# Patient Record
Sex: Male | Born: 1965 | Race: White | Hispanic: No | Marital: Married | Smoking: Never smoker
Health system: Southern US, Community
[De-identification: ages and names within clinical notes are randomized; demographics above are authoritative.]

## PROBLEM LIST (undated history)

## (undated) DIAGNOSIS — M1990 Unspecified osteoarthritis, unspecified site: Secondary | ICD-10-CM

## (undated) DIAGNOSIS — J189 Pneumonia, unspecified organism: Secondary | ICD-10-CM

## (undated) HISTORY — PX: WISDOM TOOTH EXTRACTION: SHX21

---

## 2005-01-08 HISTORY — PX: OTHER SURGICAL HISTORY: SHX169

## 2008-01-09 HISTORY — PX: HERNIA REPAIR: SHX51

## 2012-01-09 HISTORY — PX: CHOLECYSTECTOMY: SHX55

## 2014-10-12 ENCOUNTER — Other Ambulatory Visit: Payer: Self-pay | Admitting: Orthopedic Surgery

## 2014-10-12 DIAGNOSIS — M25511 Pain in right shoulder: Secondary | ICD-10-CM

## 2014-10-26 ENCOUNTER — Ambulatory Visit
Admission: RE | Admit: 2014-10-26 | Discharge: 2014-10-26 | Disposition: A | Discharge status: Home / Self Care | Payer: PRIVATE HEALTH INSURANCE | Source: Ambulatory Visit | Attending: Orthopedic Surgery | Admitting: Orthopedic Surgery

## 2014-10-26 DIAGNOSIS — M25511 Pain in right shoulder: Secondary | ICD-10-CM

## 2014-10-26 MED ORDER — IOHEXOL 180 MG/ML  SOLN
15.0000 mL | Freq: Once | INTRAMUSCULAR | Status: DC | PRN
  Administered 2014-10-26: 15 mL via INTRA_ARTICULAR

## 2015-03-30 ENCOUNTER — Other Ambulatory Visit: Payer: Self-pay | Admitting: Orthopedic Surgery

## 2015-04-06 ENCOUNTER — Encounter (HOSPITAL_COMMUNITY)
Admission: RE | Admit: 2015-04-06 | Discharge: 2015-04-06 | Disposition: A | Discharge status: Home / Self Care | Payer: Worker's Compensation | Source: Ambulatory Visit | Attending: Orthopedic Surgery | Admitting: Orthopedic Surgery

## 2015-04-06 ENCOUNTER — Encounter (HOSPITAL_COMMUNITY): Payer: Self-pay

## 2015-04-06 DIAGNOSIS — S43431A Superior glenoid labrum lesion of right shoulder, initial encounter: Secondary | ICD-10-CM | POA: Diagnosis not present

## 2015-04-06 DIAGNOSIS — X58XXXA Exposure to other specified factors, initial encounter: Secondary | ICD-10-CM | POA: Insufficient documentation

## 2015-04-06 DIAGNOSIS — Z01812 Encounter for preprocedural laboratory examination: Secondary | ICD-10-CM | POA: Insufficient documentation

## 2015-04-06 HISTORY — DX: Pneumonia, unspecified organism: J18.9

## 2015-04-06 HISTORY — DX: Unspecified osteoarthritis, unspecified site: M19.90

## 2015-04-06 LAB — BASIC METABOLIC PANEL
Anion gap: 8 (ref 5–15)
BUN: 10 mg/dL (ref 6–20)
CHLORIDE: 108 mmol/L (ref 101–111)
CO2: 24 mmol/L (ref 22–32)
CREATININE: 0.89 mg/dL (ref 0.61–1.24)
Calcium: 9.4 mg/dL (ref 8.9–10.3)
GFR calc non Af Amer: >60 mL/min (ref >60)
Glucose, Bld: 104 mg/dL — ABNORMAL HIGH (ref 65–99)
POTASSIUM: 4.3 mmol/L (ref 3.5–5.1)
Sodium: 140 mmol/L (ref 135–145)

## 2015-04-06 LAB — CBC
HEMATOCRIT: 47 % (ref 39.0–52.0)
Hemoglobin: 16.2 g/dL (ref 13.0–17.0)
MCH: 30.9 pg (ref 26.0–34.0)
MCHC: 34.5 g/dL (ref 30.0–36.0)
MCV: 89.7 fL (ref 78.0–100.0)
PLATELETS: 208 10*3/uL (ref 150–400)
RBC: 5.24 MIL/uL (ref 4.22–5.81)
RDW: 13 % (ref 11.5–15.5)
WBC: 5.6 10*3/uL (ref 4.0–10.5)

## 2015-04-06 NOTE — Progress Notes (Signed)
Pt. Denies all chest concerns, denies flulike symptoms.  Pt. Reports that he is not followed by a PCP, states he moved here only recently.  Pt. Has no recall of having an ekg in the past several yrs.  States prior to arm surgery > 10 yrs. Ago, he was told that his heart was strong & healthy.

## 2015-04-06 NOTE — Pre-Procedure Instructions (Signed)
Maurie BoettcherKevin Runnels  04/06/2015      [id number] [place], [place] - [facility] [place] [id number] Phone: [phone] Fax: [phone]    Your procedure is scheduled on 04/12/2015- TUESDAY  [facility] Admitting at 9:00 A.M.  Call this number if you have problems the morning of surgery:  [phone]   Remember:  Do not eat food or drink liquids after midnight. On Monday   Take these medicines the morning of surgery with A SIP OF WATER : NOTHING   Do not wear jewelry   Do not wear lotions, powders, or perfumes.  NO DEODORANT    Men may shave face and neck.   Do not bring valuables to the hospital.   [facility] for any belongings or valuables.  Contacts, dentures or bridgework may not be worn into surgery.  Leave your suitcase in the car.  After surgery it may be brought to your room.  For patients admitted to the hospital, discharge time will be determined by your treatment team.  Patients discharged the day of surgery will not be allowed to drive home.   Name and phone number of your driver:   With family  Special instructions:  Special Instructions: [place] - Preparing for Surgery  Before surgery, you can play an important role.  Because skin is not sterile, your skin needs to be as free of germs as possible.  You can reduce the number of germs on you skin by washing with CHG (chlorahexidine gluconate) soap before surgery.  CHG is an antiseptic cleaner which kills germs and bonds with the skin to continue killing germs even after washing.  Please DO NOT use if you have an allergy to CHG or antibacterial soaps.  If your skin becomes reddened/irritated stop using the CHG and inform your nurse when you arrive at Short Stay.  Do not shave (including legs and underarms) for at least 48 hours prior to the first CHG shower.  You may shave your face.  Please follow these  instructions carefully:   1.  Shower with CHG Soap the night before surgery and the  morning of Surgery.  2.  If you choose to wash your hair, wash your hair first as usual with your  normal shampoo.  3.  After you shampoo, rinse your hair and body thoroughly to remove the  Shampoo.  4.  Use CHG as you would any other liquid soap.  You can apply chg directly to the skin and wash gently with scrungie or a clean washcloth.  5.  Apply the CHG Soap to your body ONLY FROM THE NECK DOWN.    Do not use on open wounds or open sores.  Avoid contact with your eyes, ears, mouth and genitals (private parts).  Wash genitals (private parts)   with your normal soap.  6.  Wash thoroughly, paying special attention to the area where your surgery will be performed.  7.  Thoroughly rinse your body with warm water from the neck down.  8.  DO NOT shower/wash with your normal soap after using and rinsing off   the CHG Soap.  9.  Pat yourself dry with a clean towel.            10.  Wear clean pajamas.            11.  Place clean sheets on your bed the night of  your first shower and do not sleep with pets.  Day of Surgery  Do not apply any lotions/deodorants the morning of surgery.  Please wear clean clothes to the hospital/surgery center.  Please read over the following fact sheets that you were given. Pain Booklet, Coughing and Deep Breathing and Surgical Site Infection Prevention

## 2015-04-11 MED ORDER — CEFAZOLIN SODIUM-DEXTROSE 2-4 GM/100ML-% IV SOLN
2.0000 g | INTRAVENOUS | Status: AC
  Administered 2015-04-12: 2 g via INTRAVENOUS
  Filled 2015-04-11: qty 100

## 2015-04-12 ENCOUNTER — Encounter (HOSPITAL_COMMUNITY)
Admission: RE | Disposition: A | Discharge status: Home / Self Care | Payer: Self-pay | Source: Ambulatory Visit | Attending: Orthopedic Surgery

## 2015-04-12 ENCOUNTER — Ambulatory Visit (HOSPITAL_COMMUNITY)
Admission: RE | Admit: 2015-04-12 | Discharge: 2015-04-12 | Disposition: A | Discharge status: Home / Self Care | Payer: Worker's Compensation | Source: Ambulatory Visit | Attending: Orthopedic Surgery | Admitting: Orthopedic Surgery

## 2015-04-12 ENCOUNTER — Ambulatory Visit (HOSPITAL_COMMUNITY): Payer: Worker's Compensation | Admitting: Certified Registered Nurse Anesthetist

## 2015-04-12 ENCOUNTER — Encounter (HOSPITAL_COMMUNITY): Payer: Self-pay | Admitting: *Deleted

## 2015-04-12 DIAGNOSIS — S43431A Superior glenoid labrum lesion of right shoulder, initial encounter: Secondary | ICD-10-CM | POA: Insufficient documentation

## 2015-04-12 DIAGNOSIS — M7551 Bursitis of right shoulder: Secondary | ICD-10-CM | POA: Diagnosis not present

## 2015-04-12 DIAGNOSIS — X58XXXA Exposure to other specified factors, initial encounter: Secondary | ICD-10-CM | POA: Insufficient documentation

## 2015-04-12 DIAGNOSIS — Z5333 Arthroscopic surgical procedure converted to open procedure: Secondary | ICD-10-CM | POA: Diagnosis not present

## 2015-04-12 HISTORY — PX: SHOULDER ARTHROSCOPY WITH SUBACROMIAL DECOMPRESSION AND BICEP TENDON REPAIR: SHX5689

## 2015-04-12 SURGERY — SHOULDER ARTHROSCOPY WITH SUBACROMIAL DECOMPRESSION AND BICEP TENDON REPAIR
Anesthesia: General | Site: Shoulder | Laterality: Right

## 2015-04-12 MED ORDER — FENTANYL CITRATE (PF) 100 MCG/2ML IJ SOLN
100.0000 ug | Freq: Once | INTRAMUSCULAR | Status: AC
  Administered 2015-04-12: 100 ug via INTRAVENOUS

## 2015-04-12 MED ORDER — GLYCOPYRROLATE 0.2 MG/ML IJ SOLN
INTRAMUSCULAR | Status: AC
  Filled 2015-04-12: qty 1

## 2015-04-12 MED ORDER — SODIUM CHLORIDE 0.9 % IR SOLN
Status: DC | PRN
  Administered 2015-04-12: 6000 mL

## 2015-04-12 MED ORDER — ARTIFICIAL TEARS OP OINT
TOPICAL_OINTMENT | OPHTHALMIC | Status: AC
  Filled 2015-04-12: qty 3.5

## 2015-04-12 MED ORDER — LACTATED RINGERS IV SOLN
INTRAVENOUS | Status: DC
  Administered 2015-04-12 (×2): via INTRAVENOUS

## 2015-04-12 MED ORDER — EPHEDRINE SULFATE 50 MG/ML IJ SOLN
INTRAMUSCULAR | Status: AC
  Filled 2015-04-12: qty 1

## 2015-04-12 MED ORDER — SUGAMMADEX SODIUM 200 MG/2ML IV SOLN
INTRAVENOUS | Status: AC
  Filled 2015-04-12: qty 2

## 2015-04-12 MED ORDER — LIDOCAINE HCL (CARDIAC) 20 MG/ML IV SOLN
INTRAVENOUS | Status: DC | PRN
  Administered 2015-04-12: 100 mg via INTRATRACHEAL

## 2015-04-12 MED ORDER — MEPERIDINE HCL 25 MG/ML IJ SOLN: 6.2500 mg | INTRAMUSCULAR | Status: DC | PRN

## 2015-04-12 MED ORDER — ONDANSETRON HCL 4 MG/2ML IJ SOLN
INTRAMUSCULAR | Status: AC
  Filled 2015-04-12: qty 2

## 2015-04-12 MED ORDER — MIDAZOLAM HCL 5 MG/ML IJ SOLN: 2.0000 mg | Freq: Once | INTRAMUSCULAR | Status: DC

## 2015-04-12 MED ORDER — PHENYLEPHRINE HCL 10 MG/ML IJ SOLN
INTRAMUSCULAR | Status: DC | PRN
  Administered 2015-04-12 (×2): 40 ug via INTRAVENOUS

## 2015-04-12 MED ORDER — 0.9 % SODIUM CHLORIDE (POUR BTL) OPTIME
TOPICAL | Status: DC | PRN
  Administered 2015-04-12: 1000 mL

## 2015-04-12 MED ORDER — CHLORHEXIDINE GLUCONATE 4 % EX LIQD: 60.0000 mL | Freq: Once | CUTANEOUS | Status: DC

## 2015-04-12 MED ORDER — FENTANYL CITRATE (PF) 250 MCG/5ML IJ SOLN
INTRAMUSCULAR | Status: AC
  Filled 2015-04-12: qty 5

## 2015-04-12 MED ORDER — FENTANYL CITRATE (PF) 100 MCG/2ML IJ SOLN
INTRAMUSCULAR | Status: AC
  Filled 2015-04-12: qty 2

## 2015-04-12 MED ORDER — ARTIFICIAL TEARS OP OINT
TOPICAL_OINTMENT | OPHTHALMIC | Status: DC | PRN
  Administered 2015-04-12: 1 via OPHTHALMIC

## 2015-04-12 MED ORDER — HYDROMORPHONE HCL 1 MG/ML IJ SOLN: 0.2500 mg | INTRAMUSCULAR | Status: DC | PRN

## 2015-04-12 MED ORDER — ONDANSETRON HCL 4 MG/2ML IJ SOLN
INTRAMUSCULAR | Status: DC | PRN
  Administered 2015-04-12: 4 mg via INTRAVENOUS

## 2015-04-12 MED ORDER — PROPOFOL 10 MG/ML IV BOLUS
INTRAVENOUS | Status: AC
  Filled 2015-04-12: qty 20

## 2015-04-12 MED ORDER — ONDANSETRON HCL 4 MG/2ML IJ SOLN: 4.0000 mg | Freq: Once | INTRAMUSCULAR | Status: DC | PRN

## 2015-04-12 MED ORDER — METHOCARBAMOL 500 MG PO TABS: 500.0000 mg | ORAL_TABLET | Freq: Four times a day (QID) | ORAL | Status: AC

## 2015-04-12 MED ORDER — SUGAMMADEX SODIUM 200 MG/2ML IV SOLN
INTRAVENOUS | Status: DC | PRN
  Administered 2015-04-12: 200 mg via INTRAVENOUS

## 2015-04-12 MED ORDER — BUPIVACAINE-EPINEPHRINE (PF) 0.5% -1:200000 IJ SOLN
INTRAMUSCULAR | Status: DC | PRN
  Administered 2015-04-12: 30 mL via PERINEURAL

## 2015-04-12 MED ORDER — EPINEPHRINE HCL 1 MG/ML IJ SOLN
INTRAMUSCULAR | Status: AC
  Filled 2015-04-12: qty 1

## 2015-04-12 MED ORDER — GLYCOPYRROLATE 0.2 MG/ML IJ SOLN
INTRAMUSCULAR | Status: DC | PRN
  Administered 2015-04-12: 0.2 mg via INTRAVENOUS

## 2015-04-12 MED ORDER — HYDROCODONE-ACETAMINOPHEN 10-325 MG PO TABS: 1.0000 | ORAL_TABLET | ORAL | Status: DC | PRN

## 2015-04-12 MED ORDER — SUCCINYLCHOLINE CHLORIDE 20 MG/ML IJ SOLN
INTRAMUSCULAR | Status: AC
  Filled 2015-04-12: qty 1

## 2015-04-12 MED ORDER — FENTANYL CITRATE (PF) 250 MCG/5ML IJ SOLN
INTRAMUSCULAR | Status: DC | PRN
  Administered 2015-04-12: 50 ug via INTRAVENOUS
  Administered 2015-04-12: 150 ug via INTRAVENOUS
  Administered 2015-04-12: 50 ug via INTRAVENOUS

## 2015-04-12 MED ORDER — SODIUM CHLORIDE 0.9 % IJ SOLN
INTRAMUSCULAR | Status: DC | PRN
  Administered 2015-04-12: 20 mL
  Administered 2015-04-12: 30 mL

## 2015-04-12 MED ORDER — EPHEDRINE SULFATE 50 MG/ML IJ SOLN
INTRAMUSCULAR | Status: DC | PRN
  Administered 2015-04-12 (×3): 10 mg via INTRAVENOUS

## 2015-04-12 MED ORDER — MIDAZOLAM HCL 2 MG/2ML IJ SOLN
INTRAMUSCULAR | Status: AC
  Filled 2015-04-12: qty 2

## 2015-04-12 MED ORDER — ROCURONIUM BROMIDE 50 MG/5ML IV SOLN
INTRAVENOUS | Status: AC
  Filled 2015-04-12: qty 1

## 2015-04-12 MED ORDER — PHENYLEPHRINE 40 MCG/ML (10ML) SYRINGE FOR IV PUSH (FOR BLOOD PRESSURE SUPPORT)
PREFILLED_SYRINGE | INTRAVENOUS | Status: AC
  Filled 2015-04-12: qty 10

## 2015-04-12 MED ORDER — MIDAZOLAM HCL 2 MG/2ML IJ SOLN
INTRAMUSCULAR | Status: AC
  Administered 2015-04-12: 2 mg
  Filled 2015-04-12: qty 2

## 2015-04-12 MED ORDER — EPINEPHRINE HCL 1 MG/ML IJ SOLN
INTRAMUSCULAR | Status: DC | PRN
  Administered 2015-04-12: .1 mL

## 2015-04-12 MED ORDER — ROCURONIUM BROMIDE 100 MG/10ML IV SOLN
INTRAVENOUS | Status: DC | PRN
  Administered 2015-04-12: 50 mg via INTRAVENOUS

## 2015-04-12 MED ORDER — LIDOCAINE HCL (CARDIAC) 20 MG/ML IV SOLN
INTRAVENOUS | Status: AC
  Filled 2015-04-12: qty 5

## 2015-04-12 MED ORDER — MIDAZOLAM HCL 2 MG/2ML IJ SOLN
INTRAMUSCULAR | Status: DC | PRN
  Administered 2015-04-12 (×2): 1 mg via INTRAVENOUS

## 2015-04-12 MED ORDER — PROPOFOL 10 MG/ML IV BOLUS
INTRAVENOUS | Status: DC | PRN
  Administered 2015-04-12: 150 mg via INTRAVENOUS
  Administered 2015-04-12: 50 mg via INTRAVENOUS

## 2015-04-12 SURGICAL SUPPLY — 72 items
BENZOIN TINCTURE PRP APPL 2/3 (GAUZE/BANDAGES/DRESSINGS) ×2 IMPLANT
BIT DRILL TAK (DRILL) IMPLANT
BLADE CUDA 5.5 (BLADE) IMPLANT
BLADE CUTTER GATOR 3.5 (BLADE) ×2 IMPLANT
BLADE GREAT WHITE 4.2 (BLADE) IMPLANT
BLADE SURG 11 STRL SS (BLADE) ×2 IMPLANT
BUR GATOR 2.9 (BURR) IMPLANT
BUR OVAL 6.0 (BURR) ×2 IMPLANT
CANNULA SHOULDER 7CM (CANNULA) IMPLANT
CARTRIDGE CURVETEK MED (MISCELLANEOUS) IMPLANT
CARTRIDGE CURVETEK XLRG (MISCELLANEOUS) IMPLANT
COVER SURGICAL LIGHT HANDLE (MISCELLANEOUS) ×2 IMPLANT
DRAPE INCISE IOBAN 66X45 STRL (DRAPES) ×2 IMPLANT
DRAPE STERI 35X30 U-POUCH (DRAPES) ×2 IMPLANT
DRAPE U-SHAPE 47X51 STRL (DRAPES) ×4 IMPLANT
DRILL TAK (DRILL)
DRSG PAD ABDOMINAL 8X10 ST (GAUZE/BANDAGES/DRESSINGS) IMPLANT
DRSG TEGADERM 4X4.75 (GAUZE/BANDAGES/DRESSINGS) ×2 IMPLANT
DURAPREP 26ML APPLICATOR (WOUND CARE) ×2 IMPLANT
ELECT MENISCUS 165MM 90D (ELECTRODE) IMPLANT
ELECT REM PT RETURN 9FT ADLT (ELECTROSURGICAL) ×2
ELECTRODE REM PT RTRN 9FT ADLT (ELECTROSURGICAL) ×1 IMPLANT
FILTER STRAW FLUID ASPIR (MISCELLANEOUS) ×2 IMPLANT
GAUZE SPONGE 4X4 12PLY STRL (GAUZE/BANDAGES/DRESSINGS) ×2 IMPLANT
GAUZE XEROFORM 1X8 LF (GAUZE/BANDAGES/DRESSINGS) ×2 IMPLANT
GLOVE BIO SURGEON ST LM GN SZ9 (GLOVE) ×2 IMPLANT
GLOVE BIOGEL PI IND STRL 8 (GLOVE) ×1 IMPLANT
GLOVE BIOGEL PI INDICATOR 8 (GLOVE) ×1
GLOVE SURG ORTHO 8.0 STRL STRW (GLOVE) ×2 IMPLANT
GOWN STRL REUS W/ TWL LRG LVL3 (GOWN DISPOSABLE) ×3 IMPLANT
GOWN STRL REUS W/TWL LRG LVL3 (GOWN DISPOSABLE) ×3
KIT BASIN OR (CUSTOM PROCEDURE TRAY) ×2 IMPLANT
KIT ROOM TURNOVER OR (KITS) ×2 IMPLANT
MANIFOLD NEPTUNE II (INSTRUMENTS) ×2 IMPLANT
NDL SUT 6 .5 CRC .975X.05 MAYO (NEEDLE) ×1 IMPLANT
NEEDLE HYPO 25X1 1.5 SAFETY (NEEDLE) ×2 IMPLANT
NEEDLE MAYO TAPER (NEEDLE) ×1
NEEDLE SPNL 18GX3.5 QUINCKE PK (NEEDLE) ×2 IMPLANT
NEEDLE SPNL 22GX3.5 QUINCKE BK (NEEDLE) ×2 IMPLANT
NS IRRIG 1000ML POUR BTL (IV SOLUTION) ×2 IMPLANT
PACK SHOULDER (CUSTOM PROCEDURE TRAY) ×2 IMPLANT
PAD ARMBOARD 7.5X6 YLW CONV (MISCELLANEOUS) ×4 IMPLANT
SCREW  BIO TENODESIS 8X23 (Screw) ×1 IMPLANT
SCREW BIO TENODESIS 8X23 (Screw) ×1 IMPLANT
SCREW BIOCOMPOSITE 7X23 (Screw) IMPLANT
SET ARTHROSCOPY TUBING (MISCELLANEOUS) ×1
SET ARTHROSCOPY TUBING LN (MISCELLANEOUS) ×1 IMPLANT
SLING ARM IMMOBILIZER LRG (SOFTGOODS) ×2 IMPLANT
SLING ARM IMMOBILIZER MED (SOFTGOODS) IMPLANT
SPEAR FASTAKII (SLEEVE) IMPLANT
SPONGE LAP 4X18 X RAY DECT (DISPOSABLE) ×2 IMPLANT
STRIP CLOSURE SKIN 1/2X4 (GAUZE/BANDAGES/DRESSINGS) ×2 IMPLANT
SUCTION FRAZIER HANDLE 10FR (MISCELLANEOUS)
SUCTION TUBE FRAZIER 10FR DISP (MISCELLANEOUS) IMPLANT
SUT ETHILON 3 0 PS 1 (SUTURE) ×2 IMPLANT
SUT FIBERWIRE 2-0 18 17.9 3/8 (SUTURE)
SUT PROLENE 3 0 PS 2 (SUTURE) ×2 IMPLANT
SUT VIC AB 0 CT1 27 (SUTURE) ×2
SUT VIC AB 0 CT1 27XBRD ANBCTR (SUTURE) ×2 IMPLANT
SUT VIC AB 1 CT1 27 (SUTURE) ×1
SUT VIC AB 1 CT1 27XBRD ANBCTR (SUTURE) ×1 IMPLANT
SUT VIC AB 2-0 CT1 27 (SUTURE) ×1
SUT VIC AB 2-0 CT1 TAPERPNT 27 (SUTURE) ×1 IMPLANT
SUT VICRYL 0 UR6 27IN ABS (SUTURE) IMPLANT
SUTURE FIBERWR 2-0 18 17.9 3/8 (SUTURE) IMPLANT
SYR 20CC LL (SYRINGE) ×4 IMPLANT
SYR 3ML LL SCALE MARK (SYRINGE) ×2 IMPLANT
SYR TB 1ML LUER SLIP (SYRINGE) ×2 IMPLANT
TOWEL OR 17X24 6PK STRL BLUE (TOWEL DISPOSABLE) ×2 IMPLANT
TOWEL OR 17X26 10 PK STRL BLUE (TOWEL DISPOSABLE) ×2 IMPLANT
WAND HAND CNTRL MULTIVAC 90 (MISCELLANEOUS) ×2 IMPLANT
WATER STERILE IRR 1000ML POUR (IV SOLUTION) ×2 IMPLANT

## 2015-04-12 NOTE — H&P (Signed)
James BoettcherKevin Drake is an 50 y.o. male.   Chief Complaint:right shoulder pain  HPI: Caryn BeeKevin is a 50 year old patient with right shoulder pain. Sustained a work injury many months ago. MRI scan consistent with SLAP tear and bursitis. Rotator cuff is intact. Presents now for operative management after failure of nonoperative management Patient risks and benefits  Past Medical History  Diagnosis Date  . Pneumonia     treated as an OP  . Arthritis     arthritis - wrists     Past Surgical History  Procedure Laterality Date  . Arm surgery Left 2007    Larm, following   . Hernia repair Left 2010  . [place]olecystectomy  [place]    [place] , [place]  . Wisdom tooth extraction      no complications     History reviewed. No pertinent family history. Social History:  reports that he has never smoked. He does not have any smokeless tobacco history on file. He reports that he drinks alcohol. He reports that he does not use illicit drugs.  Allergies: No Known Allergies  Medications Prior to Admission  Medication Sig Dispense Refill  . acetaminophen (TYLENOL) 325 MG tablet Take 650 mg by mouth every 6 (six) hours as needed for mild pain.      No results found for this or any previous visit (from the past 48 hour(s)). No results found.  Review of Systems  Constitutional: Negative.   HENT: Negative.   Eyes: Negative.   Respiratory: Negative.   Cardiovascular: Negative.   Gastrointestinal: Negative.   Genitourinary: Negative.   Musculoskeletal: Positive for joint pain.  Skin: Negative.   Neurological: Negative.   Endo/Heme/Allergies: Negative.   Psychiatric/Behavioral: Negative.     Blood pressure 121/78, pulse 82, temperature 98.7 F (37.1 C), temperature source Oral, resp. rate 19, height 6' (1.829 m), weight 68.04 kg (150 lb), SpO2 100 %. Physical Exam  Constitutional: He appears well-developed.  HENT:  Head: Normocephalic.  Eyes: Pupils are equal, round, and reactive to light.  Neck: Normal  range of motion.  Cardiovascular: Normal rate.   Respiratory: Effort normal.  Neurological: He is alert.  Skin: Skin is warm.  Psychiatric: He has a normal mood and affect.  examination of the right shoulder demonstrates positive O'Brien's testing full active and passive range of motion good rotator cuff strength isolated infraspinatus supraspinatus and subscap muscle testing no before meals joint tenderness to direct palpation or cross arm adduction labral leg testing is positive for pain   Assessment/Plan Impression is right shoulder SLAP tear by MRI scan and by clinical exam and history plan arthroscopy and debridement biceps tendon release labral debridement subacromial decompression and biceps tenodesis. Risks and benefits discussed with the patient couldn't limited to infection or vessel damage and complete pain relief as well as the potential need for further surgery should the tendon become loose and I do anticipate using CPM machine postop to facilitate range of motion all questions answered  Cammy CopaEAN,Gianne Shugars SCOTT, MD 04/12/2015, 10:45 AM

## 2015-04-12 NOTE — Brief Op Note (Signed)
04/12/2015  1:47 PM  PATIENT:  James BoettcherKevin Drake  50 y.o. male  PRE-OPERATIVE DIAGNOSIS:  right shoulder superior labral tear from anterior to posterior tear  POST-OPERATIVE DIAGNOSIS:  right shoulder superior labral tear from anterior to posterior tear  PROCEDURE:  Procedure(s): RIGHT SHOULDER ARTHROSCOPY WITH SUBACROMIAL DECOMPRESSION, BICEPS TENODESIS, LABRAL DEBRIDEMENT  SURGEON:  Surgeon(s): Cammy CopaScott Gregory Dean, MD  ASSISTANT: Patrick Jupiterarla Bethune RNFA  ANESTHESIA:   general  EBL: 25 ml    Total I/O In: 1500 [I.V.:1500] Out: 25 [Blood:25]  BLOOD ADMINISTERED: none  DRAINS: none   LOCAL MEDICATIONS USED:  none  SPECIMEN:  No Specimen  COUNTS:  YES  TOURNIQUET:  * No tourniquets in log *  DICTATION: .Other Dictation: Dictation Number [id number]  PLAN OF CARE: Discharge to home after PACU  PATIENT DISPOSITION:  PACU - hemodynamically stable

## 2015-04-12 NOTE — Anesthesia Procedure Notes (Addendum)
Anesthesia Regional Block:  Interscalene brachial plexus block  Pre-Anesthetic Checklist: ,, timeout performed, Correct Patient, Correct Site, Correct Laterality, Correct Procedure, Correct Position, site marked, Risks and benefits discussed,  Surgical consent,  Pre-op evaluation,  At surgeon's request and post-op pain management  Laterality: Right  Prep: chloraprep       Needles:  Injection technique: Single-shot  Needle Type: Echogenic Stimulator Needle     Needle Length: 5cm 5 cm Needle Gauge: 21 and 21 G    Additional Needles:  Procedures: ultrasound guided (picture in chart) and nerve stimulator Interscalene brachial plexus block  Nerve Stimulator or Paresthesia:  Response: 0.4 mA,   Additional Responses:   Narrative:  Start time: 04/12/2015 9:50 AM End time: 04/12/2015 10:00 AM Injection made incrementally with aspirations every 5 mL.  Performed by: Personally  Anesthesiologist: Arta BruceSSEY, [place]  Additional Notes: Monitors applied. Patient sedated. Sterile prep and drape,hand hygiene and sterile gloves were used. Relevant anatomy identified.Needle position confirmed.Local anesthetic injected incrementally after negative aspiration. Local anesthetic spread visualized around nerve(s). Vascular puncture avoided. No complications. Image printed for medical record.The patient tolerated the procedure well.        Procedure Name: Intubation Date/Time: 04/12/2015 11:44 AM Performed by: Salomon MastWALL, Caryl Fate COREY Pre-anesthesia Checklist: Patient identified, Emergency Drugs available, Suction available and Patient being monitored Patient Re-evaluated:Patient Re-evaluated prior to inductionOxygen Delivery Method: Circle system utilized Preoxygenation: Pre-oxygenation with 100% oxygen Intubation Type: IV induction Ventilation: Mask ventilation without difficulty Laryngoscope Size: Mac and 3 Grade View: Grade II Tube type: Oral Tube size: 7.0 mm Number of attempts: 1 Airway Equipment  and Method: Stylet Placement Confirmation: ETT inserted through vocal cords under direct vision,  positive ETCO2 and breath sounds checked- equal and bilateral Secured at: 23 cm Tube secured with: Tape

## 2015-04-12 NOTE — Anesthesia Postprocedure Evaluation (Signed)
Anesthesia Post Note  Patient: Maurie BoettcherKevin Gewirtz  Procedure(s) Performed: Procedure(s) (LRB): RIGHT SHOULDER ARTHROSCOPY WITH SUBACROMIAL DECOMPRESSION, BICEPS TENODESIS, LABRAL DEBRIDEMENT (Right)  Patient location during evaluation: PACU Anesthesia Type: General Level of consciousness: awake and alert Pain management: pain level controlled Vital Signs Assessment: post-procedure vital signs reviewed and stable Respiratory status: spontaneous breathing, nonlabored ventilation, respiratory function stable and patient connected to nasal cannula oxygen Cardiovascular status: blood pressure returned to baseline and stable Postop Assessment: no signs of nausea or vomiting Anesthetic complications: no    Last Vitals:  Filed Vitals:   04/12/15 1500 04/12/15 1515  BP:  126/94  Pulse: 80 78  Temp:  36.1 C  Resp: 16 16    Last Pain:  Filed Vitals:   04/12/15 1525  PainSc: 0-No pain                 Arish Redner,[place] DAVID

## 2015-04-12 NOTE — Transfer of Care (Signed)
Immediate Anesthesia Transfer of Care Note  Patient: Maurie BoettcherKevin Roundy  Procedure(s) Performed: Procedure(s): RIGHT SHOULDER ARTHROSCOPY WITH SUBACROMIAL DECOMPRESSION, BICEPS TENODESIS, LABRAL DEBRIDEMENT (Right)  Patient Location: PACU  Anesthesia Type:General  Level of Consciousness: awake, alert , oriented and patient cooperative  Airway & Oxygen Therapy: Patient Spontanous Breathing and Patient connected to nasal cannula oxygen  Post-op Assessment: Report given to RN, Post -op Vital signs reviewed and stable, Patient moving all extremities X 4 and Patient able to stick tongue midline  Post vital signs: Reviewed and stable  Last Vitals:  Filed Vitals:   04/12/15 1026 04/12/15 1416  BP: 121/78 138/89  Pulse: 82 86  Temp:    Resp: 19 11    Complications: No apparent anesthesia complications

## 2015-04-12 NOTE — Anesthesia Preprocedure Evaluation (Signed)
Anesthesia Evaluation  Patient identified by MRN, date of birth, ID band Patient awake    Reviewed: Allergy & Precautions, NPO status , Patient's Chart, lab work & pertinent test results  Airway Mallampati: I  TM Distance: >3 FB Neck ROM: Full    Dental   Pulmonary    Pulmonary exam normal       Cardiovascular Normal cardiovascular exam    Neuro/Psych    GI/Hepatic   Endo/Other    Renal/GU      Musculoskeletal   Abdominal   Peds  Hematology   Anesthesia Other Findings   Reproductive/Obstetrics                             Anesthesia Physical Anesthesia Plan  ASA: II  Anesthesia Plan: General   Post-op Pain Management: GA combined w/ Regional for post-op pain   Induction: Intravenous  Airway Management Planned: Oral ETT  Additional Equipment:   Intra-op Plan:   Post-operative Plan: Extubation in OR  Informed Consent: I have reviewed the patients History and Physical, chart, labs and discussed the procedure including the risks, benefits and alternatives for the proposed anesthesia with the patient or authorized representative who has indicated his/her understanding and acceptance.     Plan Discussed with: CRNA and Surgeon  Anesthesia Plan Comments:         Anesthesia Quick Evaluation  

## 2015-04-13 ENCOUNTER — Encounter (HOSPITAL_COMMUNITY): Payer: Self-pay | Admitting: Orthopedic Surgery

## 2015-04-13 NOTE — Op Note (Signed)
NAMVictoriano Drake:  Lauricella, Estelle                ACCOUNT NO.:  [id number]  MEDICAL RECORD NO.:  [id number]  LOCATION:  [facility]                         FACILITY:  [facility]  PHYSICIAN:  Burnard BuntingG. Scott Dean, M.D.    DATE OF BIRTH:  1965-12-18  DATE OF PROCEDURE: DATE OF DISCHARGE:  04/12/2015                              OPERATIVE REPORT   PREOPERATIVE DIAGNOSIS:  Right shoulder superior labrum anterior and posterior tear, bursitis.  POSTOPERATIVE DIAGNOSIS:  Right shoulder superior labrum anterior and posterior tear, bursitis.  PROCEDURE:  Right shoulder arthroscopy, biceps tendon release, superior labral debridement, subacromial decompression, open biceps tenodesis using Arthrex Biocomposite 8 x 23-mm interference screw.  SURGEON:  Burnard BuntingG. Scott Dean, M.D.  ASSISTANT:  Patrick Jupiterarla Bethune, RNFA  INDICATIONS:  James Drake is a [age] year old patient with symptomatic SLAP tear of right shoulder, presents for operative management after explanation of risks and benefits.  OPERATIVE FINDINGS: 1. Examination under anesthesia, range of motion, full forward flexion     and abduction with a good stability, anterior-posterior, 1+ each     direction, less than 1 cm sulcus sign. 2. Diagnostic arthroscopy.     a.     Type 2 SLAP tear, unstable.     b.     Intact glenohumeral articular surface.     c.     Intact rotator cuff.     d.     Significant bursitis within the subacromial space between      type 1 and type 2 acromion.  PROCEDURE IN DETAIL:  The patient was brought to the operating room where general endotracheal anesthesia was induced.  Preoperative antibiotics were administered.  Time-out was called.  The patient was placed in the beach-chair position with the head in neutral position. Right shoulder, arm, hand prescrubbed with alcohol and Betadine, allowed to air dry, prepped with DuraPrep solution and draped in a sterile manner.  Collier Flowersoban was used to cover the axilla as well as a significant portion of the  operative field.  It was used to cover the entire operative field once open biceps tenodesis was performed.  Solution of saline, epinephrine injected in the subacromial space, saline then injected into the shoulder joint.  The posterior portal created 2 cm medial and inferior to the posterolateral margin of the acromion. Diagnostic arthroscopy demonstrated unstable flap tear, intact rotator cuff, intact glenohumeral articular surfaces.  No evidence of synovitis within the rotator interval.  Anterior portal created under direct visualization.  The biceps tendon was released.  The labrum was debrided using ArthroCare wand.  Scope placed in the subacromial space. Bursectomy performed after creation of an additional lateral portal.  A bur was then used to shave down about 2-3 mm of the anterolateral acromion using cutting block technique.  At this time, instruments were removed from the portals, which were then closed using 3-0 nylon.  Ioban covered the entire field.  At this point in time, about a 1.5-inch incision was made off the anterolateral margin of the acromion.  Deltoid split between the anterior and medial raphe.  Stay suture placed, bicipital groove identified.  Transverse humeral ligament opened on the medial side.  Biceps tendon was then tenodesed with  an 8-mm x 23-mm Bio- Tenodesis Screw.  Good tension was achieved.  At this time, thorough irrigation was performed, deltoid split, closed using #1 Vicryl suture followed by interrupted inverted 2-0 Vicryl suture and 3-0 Monocryl. Waterproof dressing was applied.  The patient tolerated the procedure well without immediate complication, placed in a shoulder immobilizer, transferred to the recovery room in stable condition.     Burnard Bunting, M.D.     GSD/MEDQ  D:  04/12/2015  T:  04/13/2015  Job:  [id number]

## 2015-11-30 ENCOUNTER — Telehealth (INDEPENDENT_AMBULATORY_CARE_PROVIDER_SITE_OTHER): Payer: Self-pay | Admitting: *Deleted

## 2015-11-30 MED ORDER — HYDROCODONE-ACETAMINOPHEN 10-325 MG PO TABS: 1.0000 | ORAL_TABLET | ORAL | 0 refills | Status: AC | PRN

## 2015-11-30 NOTE — Telephone Encounter (Signed)
Rx refill request

## 2015-11-30 NOTE — Telephone Encounter (Signed)
IC and advised ok for Rx and they can pickup at front desk.

## 2015-11-30 NOTE — Telephone Encounter (Signed)
Ok to rf one time only pls clal thx

## 2015-11-30 NOTE — Telephone Encounter (Signed)
Pt wife called stating pt needed RX refill of hydrocodone. Pt has new Dr in [place] but since that is a two hour drive she was wondering if since they will be in town dec 5th if Dr. August Saucerean would refill this, its been about 4 months she stated. [phone]

## 2016-04-01 IMAGING — RF DG FLUORO GUIDE NDL PLC/BX
2 series · 2 of 2 positions shown · non-contrast
Comparison: none

CLINICAL DATA: RIGHT shoulder pain.

[Series 1: (hospital) · 1 of 1 slices shown (1 of 2)]
[im 1/1]
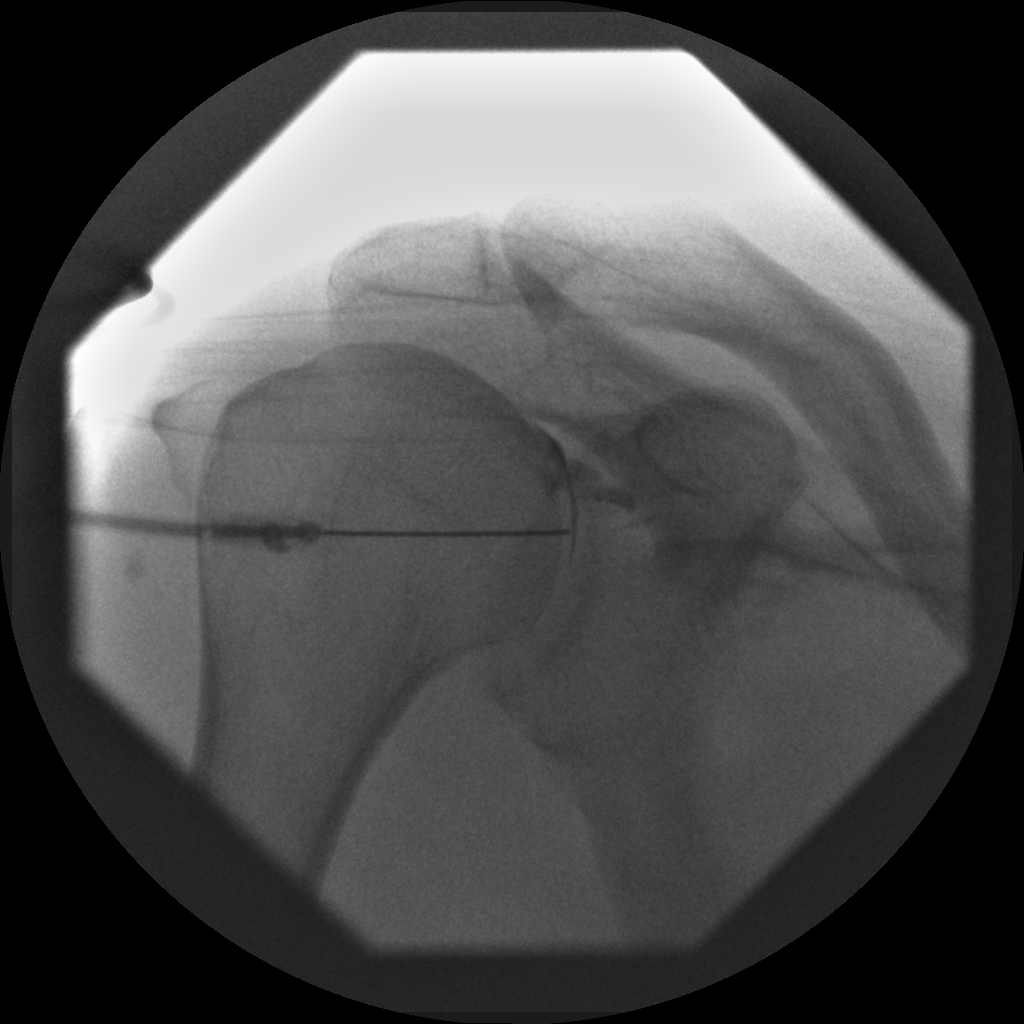

[Series 2: (hospital) · 1 of 1 slices shown (2 of 2)]
[im 1/1]
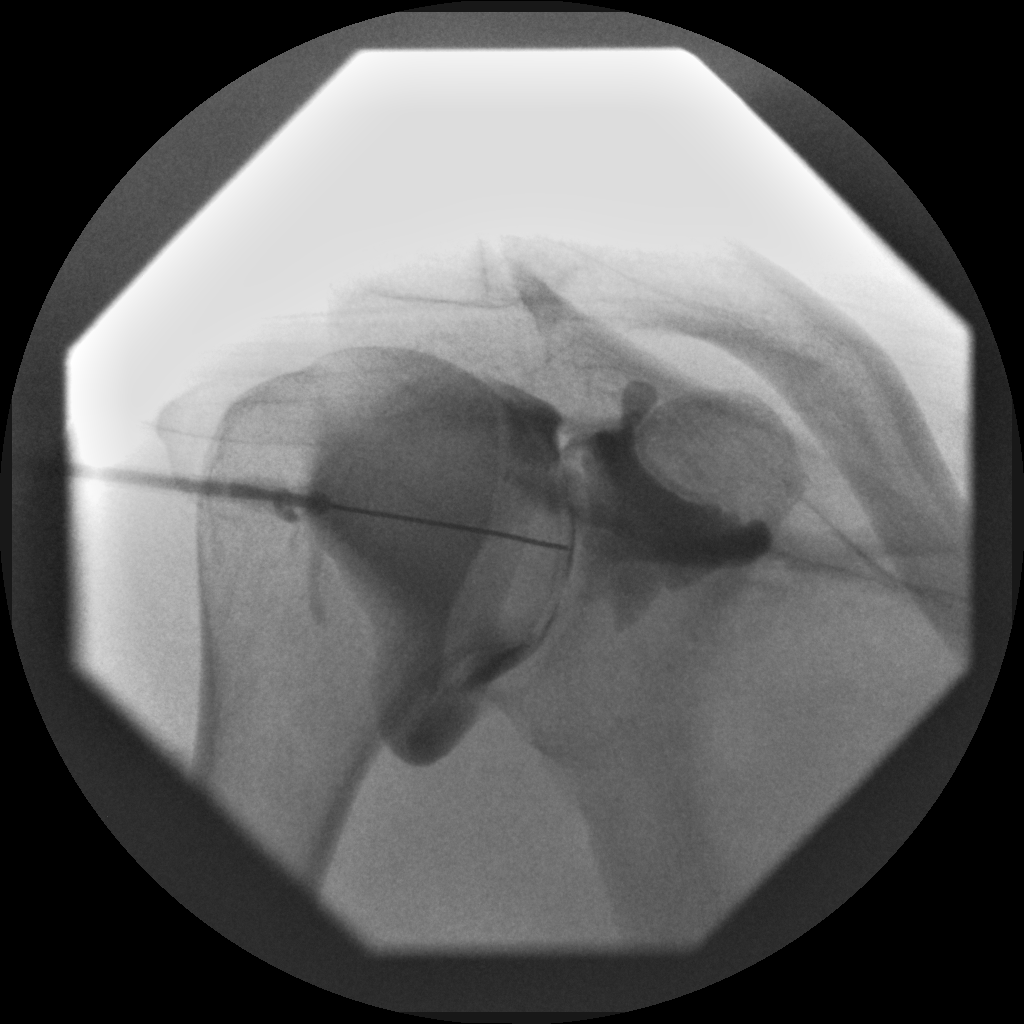

[2 of 2 positions shown; findings below may reference images not displayed]

FLUOROSCOPY TIME:  20 seconds.  One spot film.

PROCEDURE:
RIGHT SHOULDER INJECTION UNDER FLUOROSCOPY

Informed written consent was obtained.  Time-out was performed.

An appropriate skin entrance site was determined. The site was
marked, prepped with Betadine, draped in the usual sterile fashion,
and infiltrated locally with 1% Lidocaine. 22 gauge spinal needle
was advanced to the inferomedial margin of the humeral head under
intermittent fluoroscopy. 1 ml of Lidocaine injected easily. A
mixture of 0.075 ml Multihance and 20 ml of dilute Omnipaque 180 was
then used to opacify the RIGHT shoulder capsule. No immediate
complication.
IMPRESSION: Technically successful RIGHT shoulder injection for MRI.
